# Patient Record
Sex: Female | Born: 1945 | Race: White | Hispanic: No | Smoking: Never smoker
Health system: Southern US, Community
[De-identification: ages and names within clinical notes are randomized; demographics above are authoritative.]

## PROBLEM LIST (undated history)

## (undated) DIAGNOSIS — M1990 Unspecified osteoarthritis, unspecified site: Secondary | ICD-10-CM

## (undated) DIAGNOSIS — D649 Anemia, unspecified: Secondary | ICD-10-CM

## (undated) DIAGNOSIS — M419 Scoliosis, unspecified: Secondary | ICD-10-CM

## (undated) DIAGNOSIS — M810 Age-related osteoporosis without current pathological fracture: Secondary | ICD-10-CM

## (undated) HISTORY — DX: Age-related osteoporosis without current pathological fracture: M81.0

## (undated) HISTORY — PX: INGUINAL HERNIA REPAIR: SUR1180

## (undated) HISTORY — DX: Anemia, unspecified: D64.9

## (undated) HISTORY — DX: Scoliosis, unspecified: M41.9

## (undated) HISTORY — PX: BUNIONECTOMY: SHX129

## (undated) HISTORY — DX: Unspecified osteoarthritis, unspecified site: M19.90

---

## 2004-10-16 ENCOUNTER — Ambulatory Visit: Payer: Self-pay | Admitting: Internal Medicine

## 2005-09-24 ENCOUNTER — Ambulatory Visit: Payer: Self-pay | Admitting: Internal Medicine

## 2005-12-20 ENCOUNTER — Ambulatory Visit: Payer: Self-pay | Admitting: Gastroenterology

## 2006-12-13 ENCOUNTER — Ambulatory Visit: Payer: Self-pay | Admitting: Internal Medicine

## 2008-02-14 ENCOUNTER — Ambulatory Visit: Payer: Self-pay | Admitting: Internal Medicine

## 2010-02-10 ENCOUNTER — Ambulatory Visit: Payer: Self-pay | Admitting: Internal Medicine

## 2011-03-03 ENCOUNTER — Ambulatory Visit: Payer: Self-pay | Admitting: Internal Medicine

## 2012-03-07 ENCOUNTER — Ambulatory Visit: Payer: Self-pay | Admitting: Internal Medicine

## 2013-03-09 ENCOUNTER — Ambulatory Visit: Payer: Self-pay | Admitting: Internal Medicine

## 2013-08-23 ENCOUNTER — Ambulatory Visit: Payer: Self-pay | Admitting: Surgery

## 2013-08-31 ENCOUNTER — Ambulatory Visit: Payer: Self-pay | Admitting: Surgery

## 2014-03-25 ENCOUNTER — Ambulatory Visit: Payer: Self-pay | Admitting: Internal Medicine

## 2014-07-20 NOTE — Op Note (Signed)
PATIENT NAME:  Pamela Hooper, Pamela Hooper MR#:  [id number] DATE OF BIRTH:  Dec 16, 1945  DATE OF PROCEDURE:  08/31/2013  PREOPERATIVE DIAGNOSIS: Left inguinal hernia.   POSTOPERATIVE DIAGNOSIS: Left inguinal hernia.  PROCEDURE: Left inguinal hernia repair.   SURGEON: Rochel Brome, MD  ANESTHESIA: General.   INDICATIONS: This 69 year old female has had recent bulging in the left groin. A left inguinal hernia was demonstrated on physical exam and repair recommended for definitive treatment.   DESCRIPTION OF PROCEDURE: The patient was placed on the operating table in the supine position under general anesthesia. The left groin was clipped and the site was prepared with ChloraPrep and draped in a sterile manner.   A transversely oriented left suprapubic incision was made, carried down through subcutaneous tissues. Several small bleeding points were cauterized. Scarpa's fascia was incised. The external oblique aponeurosis was incised along the course of its fibers to expose the floor of the inguinal canal. There was an indirect hernia sac which was dissected free from surrounding structures. The visible portion of round ligament was divided with electrocautery. The sac was dissected up to the internal ring. A high ligation of the sac was done with a 4-0 Vicryl suture ligature. The sac was excised. It did not appear to need pathological examination. The repair was carried out with a row of 0 Surgilon sutures beginning at the pubic tubercle, suturing the conjoined tendon to the shelving edge of the inguinal ligament incorporating transversalis fascia into the repair. The last stitch led to obliteration of the internal ring. It is noted that the ilioinguinal nerve was seen coursing medial to the repair site and care was made to avoid putting sutures around the ilioinguinal nerve. Next, an onlay Bard soft mesh was cut to create an oval shape of some 2.8 x 3.5 cm, placed over the repair, sutured to the fascia with  interrupted 0 Surgilon. The cut edges of the external oblique were closed with running 4-0 Vicryl. The deep fascia superior and lateral to the repair site was infiltrated with 0.5% Sensorcaine with epinephrine. Subcutaneous tissues were infiltrated as well. Scarpa's fascia was closed with interrupted 4-0 Vicryl. The skin was closed with running 4-0 Monocryl subcuticular suture and Dermabond. The patient tolerated surgery satisfactorily and was then prepared for transfer to the recovery room.  ____________________________ Lenna Sciara. Rochel Brome, MD jws:sb D: 08/31/2013 08:43:10 ET T: 08/31/2013 09:07:53 ET JOB#: [id number]  cc: Loreli Dollar, MD, <Dictator> Loreli Dollar MD ELECTRONICALLY SIGNED 08/31/2013 21:32

## 2015-02-24 ENCOUNTER — Other Ambulatory Visit: Payer: Self-pay | Admitting: Internal Medicine

## 2015-02-24 DIAGNOSIS — Z1231 Encounter for screening mammogram for malignant neoplasm of breast: Secondary | ICD-10-CM

## 2015-03-27 ENCOUNTER — Ambulatory Visit
Admission: RE | Admit: 2015-03-27 | Discharge: 2015-03-27 | Disposition: A | Discharge status: Home / Self Care | Payer: Medicare Other | Source: Ambulatory Visit | Attending: Internal Medicine | Admitting: Internal Medicine

## 2015-03-27 ENCOUNTER — Other Ambulatory Visit: Payer: Self-pay | Admitting: Internal Medicine

## 2015-03-27 DIAGNOSIS — Z1231 Encounter for screening mammogram for malignant neoplasm of breast: Secondary | ICD-10-CM | POA: Diagnosis not present

## 2016-02-25 DIAGNOSIS — M818 Other osteoporosis without current pathological fracture: Secondary | ICD-10-CM | POA: Insufficient documentation

## 2016-02-25 DIAGNOSIS — Z0000 Encounter for general adult medical examination without abnormal findings: Secondary | ICD-10-CM | POA: Insufficient documentation

## 2016-03-15 ENCOUNTER — Other Ambulatory Visit: Payer: Self-pay | Admitting: Internal Medicine

## 2016-03-15 DIAGNOSIS — Z1231 Encounter for screening mammogram for malignant neoplasm of breast: Secondary | ICD-10-CM

## 2016-04-16 ENCOUNTER — Ambulatory Visit: Payer: Medicare Other

## 2016-05-13 ENCOUNTER — Encounter: Payer: Self-pay | Admitting: Radiology

## 2016-05-13 ENCOUNTER — Ambulatory Visit
Admission: RE | Admit: 2016-05-13 | Discharge: 2016-05-13 | Disposition: A | Discharge status: Home / Self Care | Payer: Medicare HMO | Source: Ambulatory Visit | Attending: Internal Medicine | Admitting: Internal Medicine

## 2016-05-13 DIAGNOSIS — Z1231 Encounter for screening mammogram for malignant neoplasm of breast: Secondary | ICD-10-CM | POA: Insufficient documentation

## 2017-02-28 DIAGNOSIS — E782 Mixed hyperlipidemia: Secondary | ICD-10-CM | POA: Insufficient documentation

## 2017-04-25 ENCOUNTER — Other Ambulatory Visit: Payer: Self-pay | Admitting: Internal Medicine

## 2017-04-25 DIAGNOSIS — Z1231 Encounter for screening mammogram for malignant neoplasm of breast: Secondary | ICD-10-CM

## 2017-05-19 ENCOUNTER — Ambulatory Visit
Admission: RE | Admit: 2017-05-19 | Discharge: 2017-05-19 | Disposition: A | Discharge status: Home / Self Care | Payer: Medicare HMO | Source: Ambulatory Visit | Attending: Internal Medicine | Admitting: Internal Medicine

## 2017-05-19 DIAGNOSIS — Z1231 Encounter for screening mammogram for malignant neoplasm of breast: Secondary | ICD-10-CM | POA: Diagnosis present

## 2018-04-16 DIAGNOSIS — M810 Age-related osteoporosis without current pathological fracture: Secondary | ICD-10-CM | POA: Insufficient documentation

## 2018-04-27 DIAGNOSIS — K219 Gastro-esophageal reflux disease without esophagitis: Secondary | ICD-10-CM | POA: Insufficient documentation

## 2018-05-08 ENCOUNTER — Other Ambulatory Visit: Payer: Self-pay | Admitting: Internal Medicine

## 2018-05-08 DIAGNOSIS — Z1231 Encounter for screening mammogram for malignant neoplasm of breast: Secondary | ICD-10-CM

## 2018-05-23 ENCOUNTER — Ambulatory Visit
Admission: RE | Admit: 2018-05-23 | Discharge: 2018-05-23 | Disposition: A | Discharge status: Home / Self Care | Payer: Medicare HMO | Source: Ambulatory Visit | Attending: Internal Medicine | Admitting: Internal Medicine

## 2018-05-23 DIAGNOSIS — Z1231 Encounter for screening mammogram for malignant neoplasm of breast: Secondary | ICD-10-CM

## 2019-03-12 DIAGNOSIS — D369 Benign neoplasm, unspecified site: Secondary | ICD-10-CM | POA: Insufficient documentation

## 2019-04-19 ENCOUNTER — Other Ambulatory Visit: Payer: Self-pay | Admitting: Internal Medicine

## 2019-04-19 DIAGNOSIS — Z1231 Encounter for screening mammogram for malignant neoplasm of breast: Secondary | ICD-10-CM

## 2019-05-29 ENCOUNTER — Ambulatory Visit
Admission: RE | Admit: 2019-05-29 | Discharge: 2019-05-29 | Disposition: A | Discharge status: Home / Self Care | Payer: Medicare HMO | Source: Ambulatory Visit | Attending: Internal Medicine | Admitting: Internal Medicine

## 2019-05-29 DIAGNOSIS — Z1231 Encounter for screening mammogram for malignant neoplasm of breast: Secondary | ICD-10-CM | POA: Diagnosis not present

## 2020-03-12 DIAGNOSIS — M4144 Neuromuscular scoliosis, thoracic region: Secondary | ICD-10-CM | POA: Insufficient documentation

## 2020-05-14 ENCOUNTER — Other Ambulatory Visit: Payer: Self-pay | Admitting: Internal Medicine

## 2020-05-14 DIAGNOSIS — Z1231 Encounter for screening mammogram for malignant neoplasm of breast: Secondary | ICD-10-CM

## 2020-06-04 ENCOUNTER — Other Ambulatory Visit: Payer: Self-pay

## 2020-06-04 ENCOUNTER — Ambulatory Visit
Admission: RE | Admit: 2020-06-04 | Discharge: 2020-06-04 | Disposition: A | Discharge status: Home / Self Care | Payer: Medicare HMO | Source: Ambulatory Visit | Attending: Internal Medicine | Admitting: Internal Medicine

## 2020-06-04 DIAGNOSIS — Z1231 Encounter for screening mammogram for malignant neoplasm of breast: Secondary | ICD-10-CM | POA: Insufficient documentation

## 2020-09-24 ENCOUNTER — Other Ambulatory Visit (INDEPENDENT_AMBULATORY_CARE_PROVIDER_SITE_OTHER): Payer: Self-pay | Admitting: Vascular Surgery

## 2020-09-24 ENCOUNTER — Encounter (INDEPENDENT_AMBULATORY_CARE_PROVIDER_SITE_OTHER): Payer: Self-pay | Admitting: Nurse Practitioner

## 2020-09-24 ENCOUNTER — Other Ambulatory Visit: Payer: Self-pay

## 2020-09-24 ENCOUNTER — Ambulatory Visit (INDEPENDENT_AMBULATORY_CARE_PROVIDER_SITE_OTHER): Payer: Medicare HMO | Admitting: Nurse Practitioner

## 2020-09-24 ENCOUNTER — Ambulatory Visit (INDEPENDENT_AMBULATORY_CARE_PROVIDER_SITE_OTHER): Payer: Medicare HMO

## 2020-09-24 VITALS — BP 194/81 | HR 76 | Resp 16 | Ht 59.5 in | Wt 130.4 lb

## 2020-09-24 DIAGNOSIS — E782 Mixed hyperlipidemia: Secondary | ICD-10-CM | POA: Diagnosis not present

## 2020-09-24 DIAGNOSIS — Z8742 Personal history of other diseases of the female genital tract: Secondary | ICD-10-CM | POA: Insufficient documentation

## 2020-09-24 DIAGNOSIS — I8311 Varicose veins of right lower extremity with inflammation: Secondary | ICD-10-CM | POA: Diagnosis not present

## 2020-09-24 DIAGNOSIS — M79605 Pain in left leg: Secondary | ICD-10-CM

## 2020-09-24 DIAGNOSIS — M79604 Pain in right leg: Secondary | ICD-10-CM | POA: Diagnosis not present

## 2020-09-24 DIAGNOSIS — I8312 Varicose veins of left lower extremity with inflammation: Secondary | ICD-10-CM | POA: Diagnosis not present

## 2020-09-24 DIAGNOSIS — M2010 Hallux valgus (acquired), unspecified foot: Secondary | ICD-10-CM | POA: Insufficient documentation

## 2020-09-24 NOTE — Progress Notes (Signed)
Subjective:    Patient ID: Pamela Hooper, female    DOB: 1946/01/10, 75 y.o.   MRN: [id number] Chief Complaint  Patient presents with   New Patient (Initial Visit)    Ref Sabra Heck symptomatic varicose veins    The patient is seen for evaluation of symptomatic varicose veins. The patient relates burning and stinging which worsened steadily throughout the course of the day, particularly with standing. The patient also notes an aching and throbbing pain over the varicosities, particularly with prolonged dependent positions. The symptoms are significantly improved with elevation.   There is no history of DVT, PE or superficial thrombophlebitis. There is no history of ulceration or hemorrhage. The patient endorses a significant family history of varicose veins.   The patient has not worn graduated compression in the past. At the present time the patient has not been using over-the-counter analgesics. There is no history of prior surgical intervention or sclerotherapy.  Today there is no evidence of DVT or superficial thrombophlebitis bilaterally.  No evidence of deep venous insufficiency seen bilaterally.  No evidence of superficial venous reflux seen bilaterally.   Review of Systems  Cardiovascular:  Positive for leg swelling.  All other systems reviewed and are negative.     Objective:   Physical Exam Vitals reviewed.  HENT:     Head: Normocephalic.  Cardiovascular:     Rate and Rhythm: Normal rate.     Pulses: Normal pulses.  Pulmonary:     Effort: Pulmonary effort is normal.  Musculoskeletal:     Right lower leg: Edema present.     Left lower leg: Edema present.  Skin:    Comments: Notable spider varicosities seen  Neurological:     Mental Status: She is alert and oriented to person, place, and time.  Psychiatric:        Mood and Affect: Mood normal.        Behavior: Behavior normal.        Thought Content: Thought content normal.        Judgment: Judgment normal.     BP (!) 194/81 (BP Location: Right Arm)   Pulse 76   Resp 16   Ht 4' 11.5" (1.511 m)   Wt 130 lb 6.4 oz (59.1 kg)   BMI 25.90 kg/m   Past Medical History:  Diagnosis Date   Anemia    Arthritis    Osteoporosis    Scoliosis     Social History   Socioeconomic History   Marital status: Widowed    Spouse name: Not on file   Number of children: Not on file   Years of education: Not on file   Highest education level: Not on file  Occupational History   Not on file  Tobacco Use   Smoking status: Never   Smokeless tobacco: Never  Substance and Sexual Activity   Alcohol use: Never   Drug use: Never   Sexual activity: Not on file  Other Topics Concern   Not on file  Social History Narrative   Not on file   Social Determinants of Health   Financial Resource Strain: Not on file  Food Insecurity: Not on file  Transportation Needs: Not on file  Physical Activity: Not on file  Stress: Not on file  Social Connections: Not on file  Intimate Partner Violence: Not on file    Past Surgical History:  Procedure Laterality Date   BUNIONECTOMY Bilateral    CESAREAN SECTION     [phone]  INGUINAL HERNIA REPAIR      Family History  Problem Relation Age of Onset   COPD Mother    Congestive Heart Failure Mother    Arthritis/Rheumatoid Mother    Emphysema Father    Breast cancer Neg Hx     Allergies  Allergen Reactions   Bee Venom     Other reaction(s): Unknown Bee stings- swelling in the area    No flowsheet data found.    CMP  No results found for: NA, K, CL, CO2, GLUCOSE, BUN, CREATININE, CALCIUM, PROT, ALBUMIN, AST, ALT, ALKPHOS, BILITOT, GFRNONAA, GFRAA   No results found.     Assessment & Plan:   1. Varicose veins of both lower extremities with inflammation Today the patient has no evidence of venous reflux.  Based on this treatment for her varicose veins would be considered cosmetic.  I discussed conservative treatment including use of medical  grade compression stockings elevation and activity.  Currently we do not offer the ability to do cosmetic sclerotherapy in our office.  The patient should follow-up with [place] on an as-needed basis if she notices larger varicosities or increasing symptoms and we can reevaluate for venous reflux.  2. Hyperlipidemia, mixed Continue statin as ordered and reviewed, no changes at this time    Current Outpatient Medications on File Prior to Visit  Medication Sig Dispense Refill   B Complex Vitamins (VITAMIN-B COMPLEX PO) Take by mouth.     Calcium Carb-Cholecalciferol 600-400 MG-UNIT TABS Take by mouth.     Cholecalciferol (VITAMIN D3) 10 MCG (400 UNIT) tablet Take by mouth.     Chromium Picolinate 200 MCG TABS Take by mouth.     Evening Primrose Oil 1000 MG CAPS Take by mouth.     Flaxseed, Linseed, (FLAXSEED OIL PO) Take 400 mcg by mouth.     folic acid (FOLVITE) 1 MG tablet Take by mouth.     hydrocortisone (ANUSOL-HC) 2.5 % rectal cream APPLY TO AFFECTED AREA 3 TIMES A DAY     Lactobacillus Acid-Pectin (ACIDOPHILUS/PECTIN) CAPS Take by mouth.     Magnesium Oxide 500 MG TABS Take by mouth.     metroNIDAZOLE (METROGEL) 0.75 % gel APPLY TO AFFECTED AREA EVERY DAY     omeprazole (PRILOSEC) 20 MG capsule Take 20 mg by mouth 2 (two) times daily.     Red Yeast Rice Extract 600 MG CAPS Take by mouth.     Selenium 200 MCG CAPS Take by mouth.     zinc gluconate 50 MG tablet Take by mouth.     No current facility-administered medications on file prior to visit.    There are no Patient Instructions on file for this visit. No follow-ups on file.   Kris Hartmann, NP

## 2021-04-02 ENCOUNTER — Other Ambulatory Visit: Payer: Self-pay | Admitting: Internal Medicine

## 2021-04-02 DIAGNOSIS — Z1231 Encounter for screening mammogram for malignant neoplasm of breast: Secondary | ICD-10-CM

## 2021-06-05 ENCOUNTER — Ambulatory Visit
Admission: RE | Admit: 2021-06-05 | Discharge: 2021-06-05 | Disposition: A | Discharge status: Home / Self Care | Payer: Medicare HMO | Source: Ambulatory Visit | Attending: Internal Medicine | Admitting: Internal Medicine

## 2021-06-05 ENCOUNTER — Other Ambulatory Visit: Payer: Self-pay

## 2021-06-05 DIAGNOSIS — Z1231 Encounter for screening mammogram for malignant neoplasm of breast: Secondary | ICD-10-CM | POA: Insufficient documentation

## 2022-04-29 ENCOUNTER — Other Ambulatory Visit: Payer: Self-pay | Admitting: Internal Medicine

## 2022-04-29 DIAGNOSIS — Z1231 Encounter for screening mammogram for malignant neoplasm of breast: Secondary | ICD-10-CM

## 2022-06-09 ENCOUNTER — Ambulatory Visit
Admission: RE | Admit: 2022-06-09 | Discharge: 2022-06-09 | Disposition: A | Discharge status: Home / Self Care | Payer: Medicare HMO | Source: Ambulatory Visit | Attending: Internal Medicine | Admitting: Internal Medicine

## 2022-06-09 DIAGNOSIS — Z1231 Encounter for screening mammogram for malignant neoplasm of breast: Secondary | ICD-10-CM

## 2023-05-12 ENCOUNTER — Other Ambulatory Visit: Payer: Self-pay | Admitting: Internal Medicine

## 2023-05-12 DIAGNOSIS — Z1231 Encounter for screening mammogram for malignant neoplasm of breast: Secondary | ICD-10-CM

## 2023-06-13 ENCOUNTER — Ambulatory Visit
Admission: RE | Admit: 2023-06-13 | Discharge: 2023-06-13 | Disposition: A | Discharge status: Home / Self Care | Payer: Medicare HMO | Source: Ambulatory Visit | Attending: Internal Medicine | Admitting: Internal Medicine

## 2023-06-13 DIAGNOSIS — Z1231 Encounter for screening mammogram for malignant neoplasm of breast: Secondary | ICD-10-CM | POA: Insufficient documentation

## 2023-12-26 ENCOUNTER — Ambulatory Visit: Payer: Self-pay

## 2023-12-26 DIAGNOSIS — D122 Benign neoplasm of ascending colon: Secondary | ICD-10-CM | POA: Diagnosis not present

## 2023-12-26 DIAGNOSIS — Z860101 Personal history of adenomatous and serrated colon polyps: Secondary | ICD-10-CM | POA: Diagnosis present

## 2023-12-26 DIAGNOSIS — K642 Third degree hemorrhoids: Secondary | ICD-10-CM | POA: Diagnosis not present

## 2023-12-26 DIAGNOSIS — K449 Diaphragmatic hernia without obstruction or gangrene: Secondary | ICD-10-CM | POA: Diagnosis not present

## 2023-12-26 DIAGNOSIS — K2100 Gastro-esophageal reflux disease with esophagitis, without bleeding: Secondary | ICD-10-CM | POA: Diagnosis not present

## 2023-12-26 DIAGNOSIS — K5730 Diverticulosis of large intestine without perforation or abscess without bleeding: Secondary | ICD-10-CM | POA: Diagnosis not present

## 2024-02-14 IMAGING — MG MM DIGITAL SCREENING BILAT W/ TOMO AND CAD
8 series · 8 of 24 positions shown · non-contrast
Comparison: Previous exam(s).

CLINICAL DATA: Screening.

EXAM:
DIGITAL SCREENING BILATERAL MAMMOGRAM WITH TOMOSYNTHESIS AND CAD
TECHNIQUE: Bilateral screening digital craniocaudal and mediolateral oblique
mammograms were obtained. Bilateral screening digital breast
tomosynthesis was performed. The images were evaluated with
computer-aided detection.

[R MLO synth-2D]
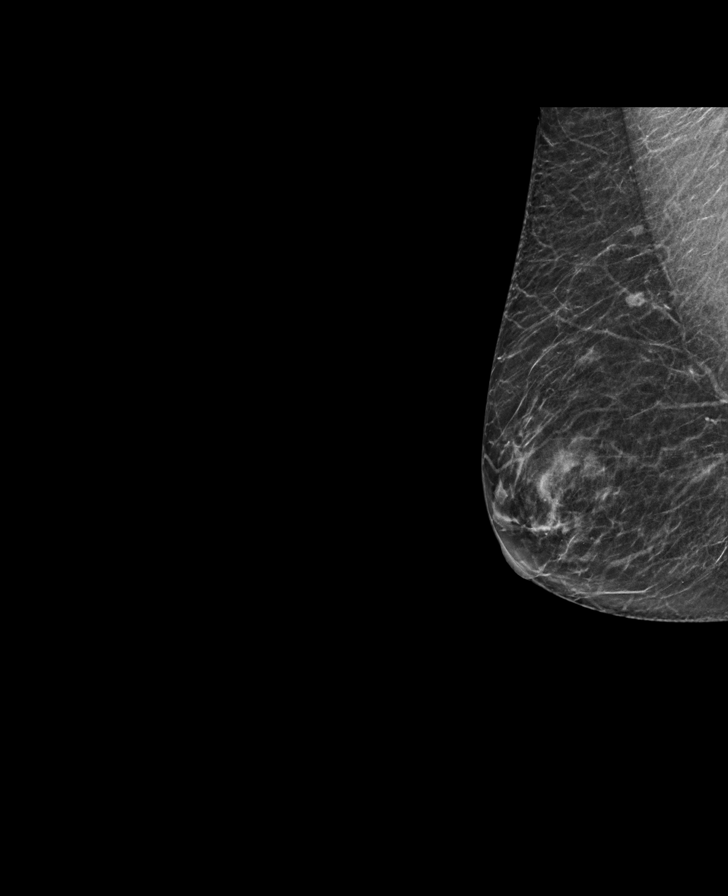

[L CC synth-2D]
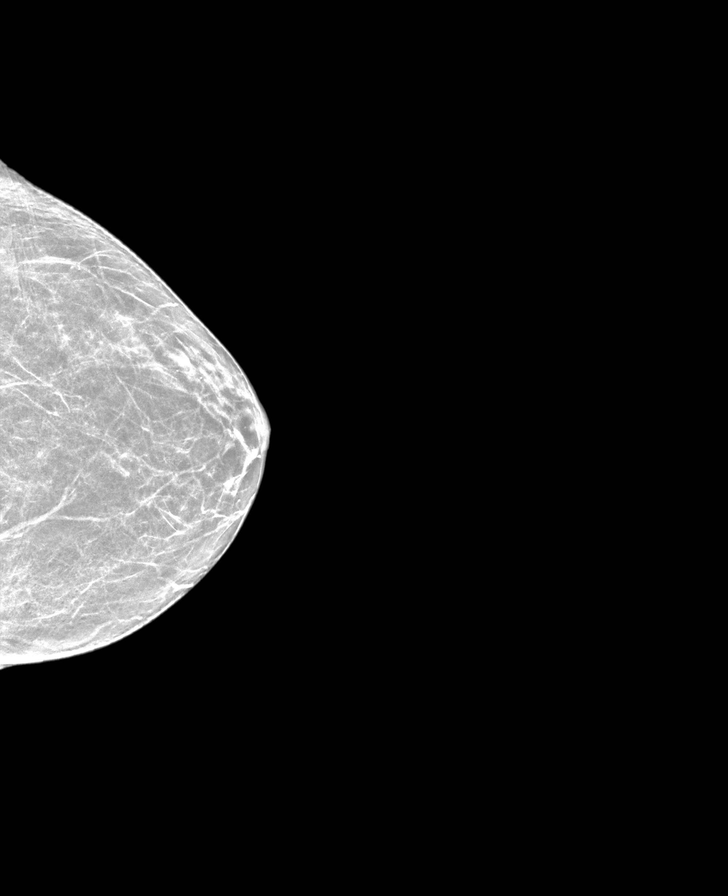

[R CC synth-2D]
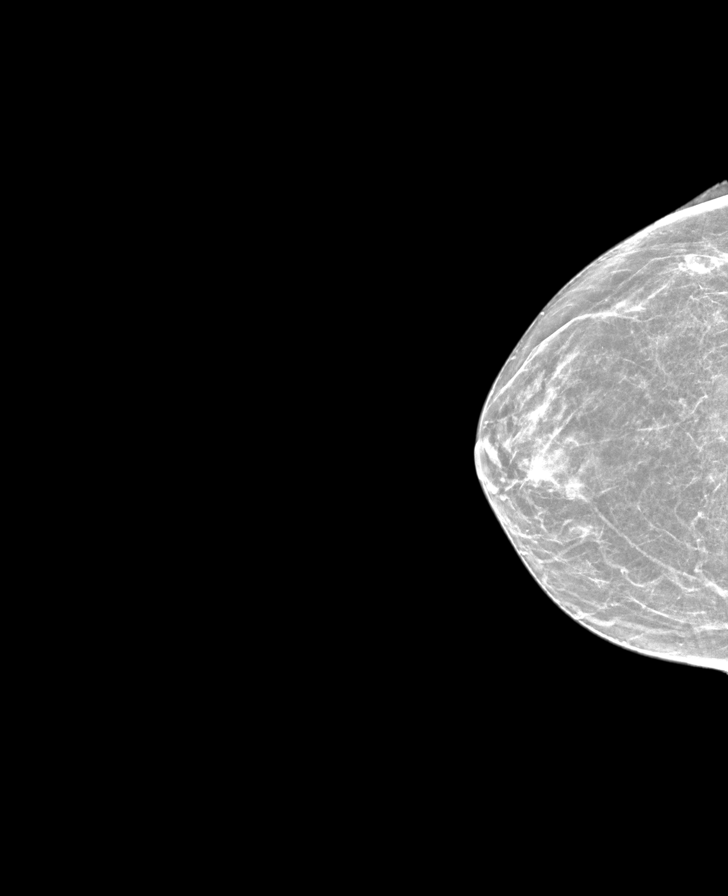

[L MLO synth-2D]
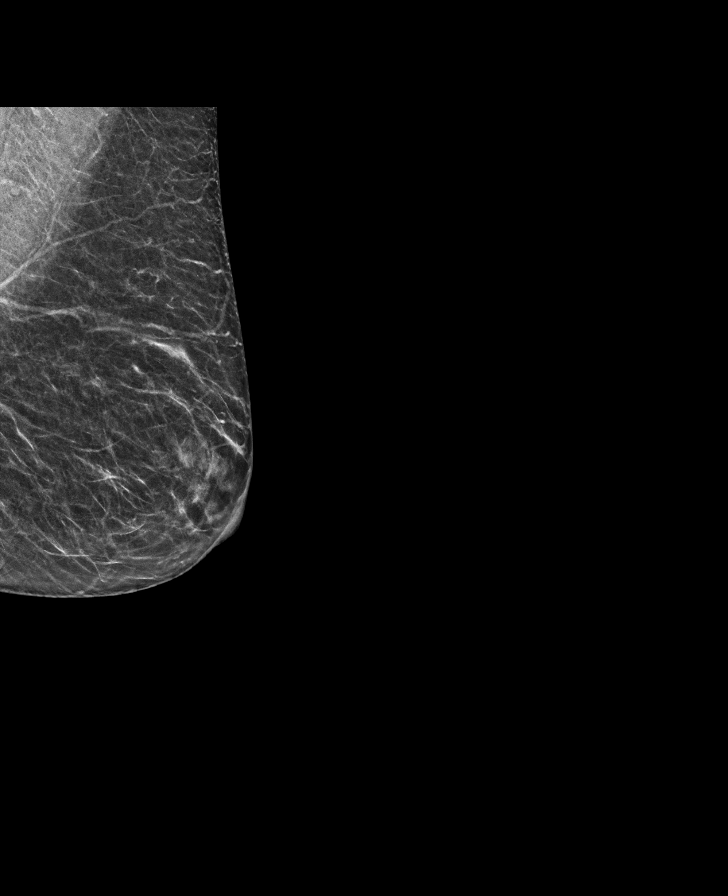

[R MLO tomo · tomo slice 27/54.0]
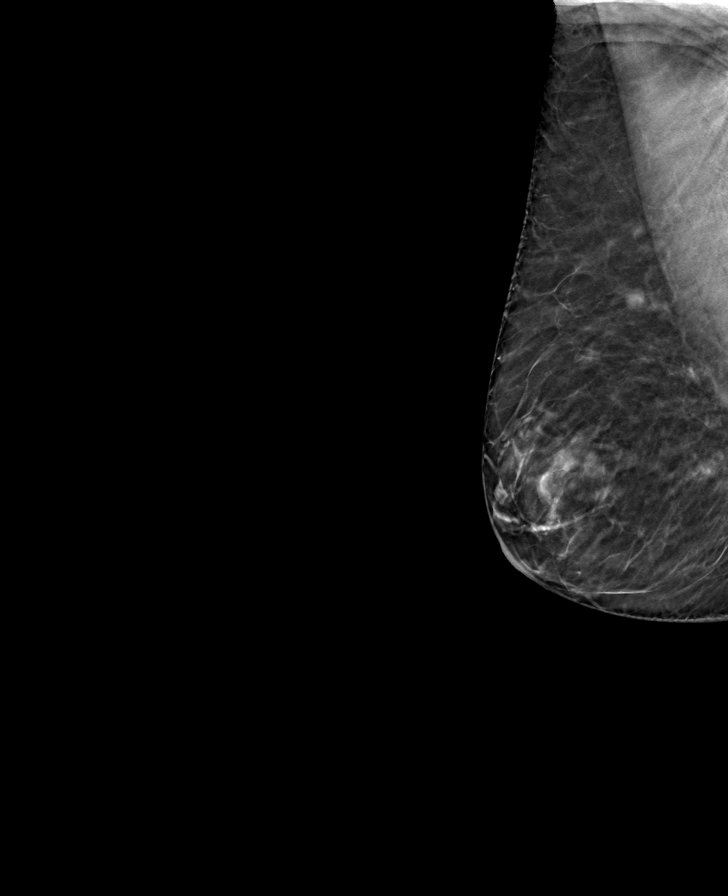

[R CC tomo · tomo slice 27/54.0]
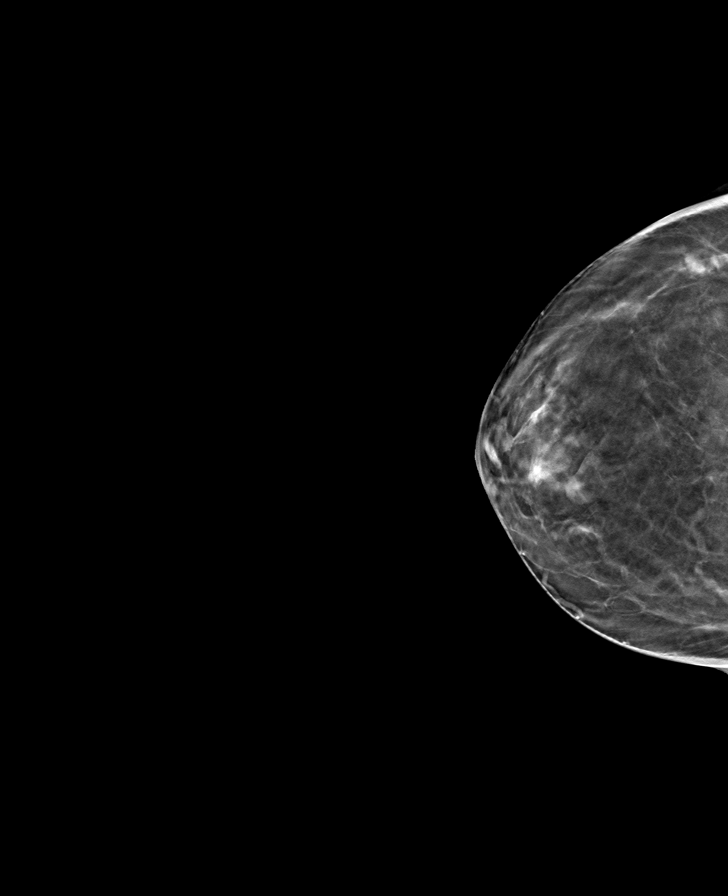

[L MLO tomo · tomo slice 27/53.0]
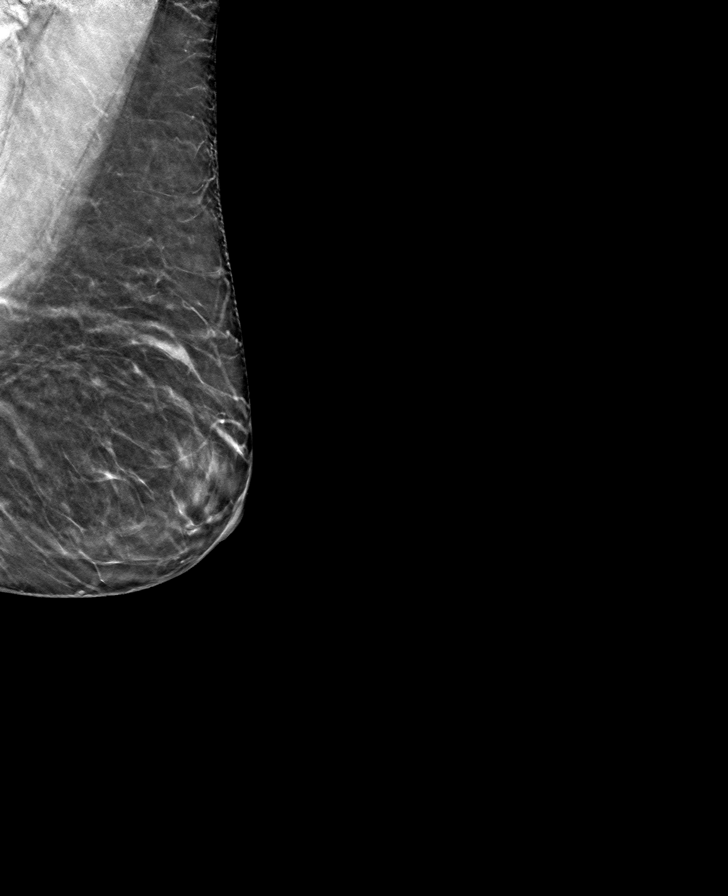

[L CC tomo · tomo slice 28/55.0]
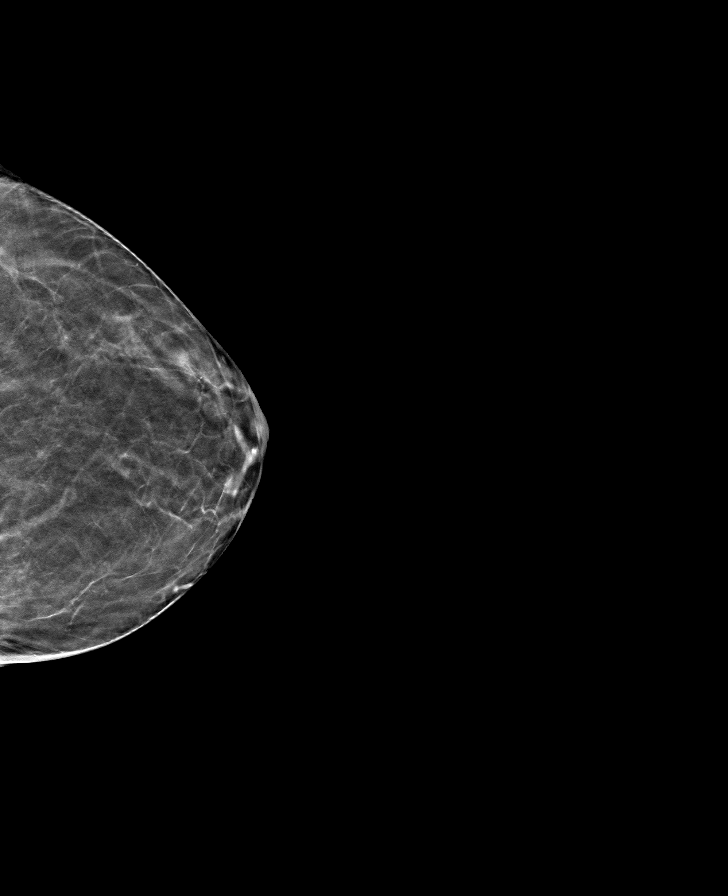

[8 of 24 positions shown; findings below may reference images not displayed]

ACR Breast Density Category b: There are scattered areas of
fibroglandular density.
FINDINGS: There are no findings suspicious for malignancy.
IMPRESSION: No mammographic evidence of malignancy. A result letter of this
screening mammogram will be mailed directly to the patient.

RECOMMENDATION:
Screening mammogram in one year. (Code:51-O-LD2)

BI-RADS CATEGORY  1: Negative.

## 2024-04-02 ENCOUNTER — Other Ambulatory Visit: Payer: Self-pay | Admitting: Internal Medicine

## 2024-04-02 DIAGNOSIS — E782 Mixed hyperlipidemia: Secondary | ICD-10-CM

## 2024-04-02 DIAGNOSIS — Z0000 Encounter for general adult medical examination without abnormal findings: Secondary | ICD-10-CM

## 2024-04-05 ENCOUNTER — Ambulatory Visit
Admission: RE | Admit: 2024-04-05 | Discharge: 2024-04-05 | Disposition: A | Discharge status: Home / Self Care | Payer: Self-pay | Source: Ambulatory Visit | Attending: Internal Medicine | Admitting: Internal Medicine

## 2024-04-05 DIAGNOSIS — Z0000 Encounter for general adult medical examination without abnormal findings: Secondary | ICD-10-CM | POA: Insufficient documentation

## 2024-04-05 DIAGNOSIS — E782 Mixed hyperlipidemia: Secondary | ICD-10-CM | POA: Insufficient documentation

## 2024-05-02 ENCOUNTER — Other Ambulatory Visit: Payer: Self-pay | Admitting: Internal Medicine

## 2024-05-02 DIAGNOSIS — Z1231 Encounter for screening mammogram for malignant neoplasm of breast: Secondary | ICD-10-CM

## 2024-06-13 ENCOUNTER — Encounter
